# Patient Record
Sex: Male | Born: 1955 | Race: Black or African American | Hispanic: No | Marital: Single | Smoking: Current every day smoker
Health system: Southern US, Community
[De-identification: ages and names within clinical notes are randomized; demographics above are authoritative.]

---

## 2017-07-22 ENCOUNTER — Other Ambulatory Visit: Payer: Self-pay

## 2017-07-22 ENCOUNTER — Emergency Department (HOSPITAL_COMMUNITY): Payer: Medicare Other

## 2017-07-22 ENCOUNTER — Encounter (HOSPITAL_COMMUNITY): Payer: Self-pay

## 2017-07-22 ENCOUNTER — Emergency Department (HOSPITAL_COMMUNITY)
Admission: EM | Admit: 2017-07-22 | Discharge: 2017-07-22 | Disposition: A | Discharge status: Home / Self Care | Payer: Medicare Other | Attending: Emergency Medicine | Admitting: Emergency Medicine

## 2017-07-22 DIAGNOSIS — F17200 Nicotine dependence, unspecified, uncomplicated: Secondary | ICD-10-CM | POA: Insufficient documentation

## 2017-07-22 DIAGNOSIS — R55 Syncope and collapse: Secondary | ICD-10-CM | POA: Diagnosis not present

## 2017-07-22 DIAGNOSIS — R079 Chest pain, unspecified: Secondary | ICD-10-CM | POA: Diagnosis present

## 2017-07-22 DIAGNOSIS — Z79899 Other long term (current) drug therapy: Secondary | ICD-10-CM | POA: Insufficient documentation

## 2017-07-22 DIAGNOSIS — I10 Essential (primary) hypertension: Secondary | ICD-10-CM | POA: Insufficient documentation

## 2017-07-22 DIAGNOSIS — R10812 Left upper quadrant abdominal tenderness: Secondary | ICD-10-CM | POA: Diagnosis not present

## 2017-07-22 DIAGNOSIS — R10814 Left lower quadrant abdominal tenderness: Secondary | ICD-10-CM | POA: Diagnosis not present

## 2017-07-22 LAB — BASIC METABOLIC PANEL
ANION GAP: 9 (ref 5–15)
BUN: 11 mg/dL (ref 6–20)
CO2: 26 mmol/L (ref 22–32)
Calcium: 8.9 mg/dL (ref 8.9–10.3)
Chloride: 104 mmol/L (ref 101–111)
Creatinine, Ser: 0.94 mg/dL (ref 0.61–1.24)
GFR calc non Af Amer: >60 mL/min (ref >60)
Glucose, Bld: 138 mg/dL — ABNORMAL HIGH (ref 65–99)
POTASSIUM: 4.2 mmol/L (ref 3.5–5.1)
SODIUM: 139 mmol/L (ref 135–145)

## 2017-07-22 LAB — CBC
HEMATOCRIT: 43 % (ref 39.0–52.0)
HEMOGLOBIN: 13.7 g/dL (ref 13.0–17.0)
MCH: 30 pg (ref 26.0–34.0)
MCHC: 31.9 g/dL (ref 30.0–36.0)
MCV: 94.3 fL (ref 78.0–100.0)
PLATELETS: 244 10*3/uL (ref 150–400)
RBC: 4.56 MIL/uL (ref 4.22–5.81)
RDW: 14.9 % (ref 11.5–15.5)
WBC: 5.7 10*3/uL (ref 4.0–10.5)

## 2017-07-22 LAB — I-STAT TROPONIN, ED
TROPONIN I, POC: 0 ng/mL (ref 0.00–0.08)
Troponin i, poc: 0 ng/mL (ref 0.00–0.08)

## 2017-07-22 NOTE — ED Notes (Signed)
Pt states hx of cocaine use, last used 1 week ago. MD aware

## 2017-07-22 NOTE — Discharge Instructions (Signed)
Your testing today has not shown any specific abnormal findings.  You should continue to follow-up with your family doctor within 2 days if no improvement, return to the emergency department for worsening chest pain difficulty breathing fevers or vomiting.

## 2017-07-22 NOTE — ED Triage Notes (Signed)
[place] arrives [place] [facility] [place] after leaving court. As as soon leaving court patient began to have left sided chest pain with nausea. Pt was given 325 ASA and 1 sl nitro tablet. Pt has 18 G IV in A/c. Pt report family history of MI.

## 2017-07-22 NOTE — ED Notes (Signed)
Lab called stating BMP hemolyzed for second time; will straight stick for sample and resend

## 2017-07-22 NOTE — ED Notes (Signed)
Patient verbalizes understanding of discharge instructions. Opportunity for questioning and answers were provided. Armband removed by staff, pt discharged from ED ambulatory.   

## 2017-07-22 NOTE — ED Provider Notes (Signed)
[facility] EMERGENCY DEPARTMENT Provider Note   CSN: [id number] Arrival date & time: 07/22/17  1126     History   Chief Complaint Chief Complaint  Patient presents with  . Chest Pain    HPI Devontay Celaya is a 62 y.o. male.  HPI  The patient is a 62 year old male, there is no history of this patient in our system and he refuses to give me much in the way of information though he does tell me that he has been diagnosed with high blood pressure and diabetes in the past though he does not take any medications despite being told to take them.  He reports that he is a smoker approximately 1-1/2 packs/day.  He presents to the hospital today with a complaint of left-sided chest and abdominal pain as well as a passing out spell which occurred just prior to arrival when the patient was leaving the court house after being arranged on charges of driving without a license.  The patient states that he had no issues with court, he was not feeling stressful, he left the court house and remembers falling to the ground and landing on the ground, the next thing he remembers she was in the ambulance.  At this time the patient denies feeling short of breath but has some left-sided chest pain.  He reports that he has been evaluated at other hospitals in the records are at those hospitals but does not give me any information.  Level 5 caveat applies secondary to the patient's lack of cooperation.  That being said review of the medical history and records from [facility] show that the patient was recently admitted approximately 1 month ago when he had a respiratory illness and required intubation for what was thought to be a possibility of pneumonia versus crackle lung versus viral infection.  It was noted that he had hepatitis C, hypertension and ultimately was found to have parainfluenza 2 virus.  He was discharged on Avelox, finished his medications.  During that hospitalization he did  have an elevated troponin at 0.2, he had a echocardiogram showing a 50% ejection fraction, a negative CT scan of the head and was positive for cocaine.  He also had a negative DVT study on June 24, 2017.  Vision states to me that he has had several episodes of passing out over time, he states no one can ever find out why, he does not recall any prodromal symptoms today.  Specifically he denied any chest pain or palpitations prior to passing out.  History reviewed. No pertinent past medical history.  There are no active problems to display for this patient.   History reviewed. No pertinent surgical history.     Home Medications    Prior to Admission medications   Medication Sig Start Date End Date Taking? Authorizing Provider  ofloxacin (OCUFLOX) 0.3 % ophthalmic solution Place 1 drop into the right eye 2 (two) times daily. 06/26/17  Yes [provider]  QUEtiapine (SEROQUEL) 300 MG tablet Take 300 mg by mouth at bedtime.   Yes [provider]    Family History History reviewed. No pertinent family history.  Social History Social History   Tobacco Use  . Smoking status: Current Every Day Smoker    Packs/day: 1.00  . Smokeless tobacco: Never Used  Substance Use Topics  . Alcohol use: Not on file  . Drug use: Yes    Types: Cocaine     Allergies   Patient has no  known allergies.   Review of Systems Review of Systems  All other systems reviewed and are negative.    Physical Exam Updated Vital Signs BP (!) 135/93   Pulse (!) 57   Temp 98 F (36.7 C) (Oral)   Resp 14   Ht 5\' 11"  (1.803 m)   Wt 67.6 kg (149 lb)   SpO2 100%   BMI 20.78 kg/m   Physical Exam  Constitutional: He appears well-developed and well-nourished. No distress.  HENT:  Head: Normocephalic and atraumatic.  Mouth/Throat: Oropharynx is clear and moist. No oropharyngeal exudate.  Eyes: EOM are normal. Right eye exhibits no discharge. Left eye exhibits no discharge. No  scleral icterus.  Haziness of the left cornea, right eye is more clear but the patient states he is very little site in his eye.  That is his baseline according to the patient  Neck: Normal range of motion. Neck supple. No JVD present. No thyromegaly present.  Cardiovascular: Normal rate, regular rhythm, normal heart sounds and intact distal pulses. Exam reveals no gallop and no friction rub.  No murmur heard. Pulmonary/Chest: Effort normal and breath sounds normal. No respiratory distress. He has no wheezes. He has no rales. He exhibits tenderness ( There is tenderness over the left side of the chest without rashes deformity crepitance or subcutaneous emphysema).  Abdominal: Soft. Bowel sounds are normal. He exhibits no distension and no mass. There is tenderness.  Mild tenderness over the left upper and left lower abdomen, no rashes, no right-sided tenderness, no guarding  Musculoskeletal: Normal range of motion. He exhibits no edema or tenderness.  The patient has full range of motion of all 4 extremities, there is no edema, no tenderness over the compartments, very supple joints  Lymphadenopathy:    He has no cervical adenopathy.  Neurological: He is alert. Coordination normal.  The patient has clear speech and is able to move all 4 extremities without difficulty, he follows commands without difficulty, he is very resistant to give very much information on his history but is able to answer simple commands and questions  Skin: Skin is warm and dry. No rash noted. No erythema.  Psychiatric: He has a normal mood and affect. His behavior is normal.  Nursing note and vitals reviewed.    ED Treatments / Results  Labs (all labs ordered are listed, but only abnormal results are displayed) Labs Reviewed  BASIC METABOLIC PANEL - Abnormal; Notable for the following components:      Result Value   Glucose, Bld 138 (*)    All other components within normal limits  CBC  I-STAT TROPONIN, ED    I-STAT TROPONIN, ED    EKG  EKG Interpretation  Date/Time:  Tuesday July 22 2017 11:28:37 EST Ventricular Rate:  64 PR Interval:  130 QRS Duration: 76 QT Interval:  394 QTC Calculation: 406 R Axis:   90 Text Interpretation:  Normal sinus rhythm Rightward axis Borderline ECG No old tracing to compare Confirmed by Eber Hong ([id number]) on 07/22/2017 11:50:01 AM       Radiology Dg Chest 2 View  Result Date: 07/22/2017 CLINICAL DATA:  Syncope. EXAM: CHEST  2 VIEW COMPARISON:  No prior. FINDINGS: Mediastinum and hilar structures normal. Lungs are clear. Heart size normal. No pleural effusion or pneumothorax. No acute bony abnormality. IMPRESSION: No acute cardiopulmonary disease. Electronically Signed   By: Maisie Fus  Register   On: 07/22/2017 11:58    Procedures Procedures (including critical care time)  Medications Ordered in ED  Medications - No data to display   Initial Impression / Assessment and Plan / ED Course  I have reviewed the triage vital signs and the nursing notes.  Pertinent labs & imaging results that were available during my care of the patient were reviewed by me and considered in my medical decision making (see chart for details).     There is no acute findings on the patient's chest x-ray or his EKG, he has had labs which thus far are unremarkable including a negative troponin, negative blood counts.  At this time the patient likely has had a syncopal episode of the cause is unclear.  He will need orthostatic vital signs.  I suspect that there is some drug use related in someway however he has never had any provocative testing of his heart.  He will need at least a second troponin.  Labs, imaging and EKG reviewed, no acute findings, second troponin also negative, patient has been sleeping and eating without complaint since arrival.  Final Clinical Impressions(s) / ED Diagnoses   Final diagnoses:  Syncope, unspecified syncope type  Chest pain, unspecified  type      Eber HongMiller, Kathlee Barnhardt, MD 07/22/17 1612

## 2018-06-09 IMAGING — CR DG CHEST 2V
2 series · 2 of 2 positions shown · non-contrast
Comparison: No prior.

CLINICAL DATA: Syncope.

EXAM:
CHEST  2 VIEW

[chest pa]
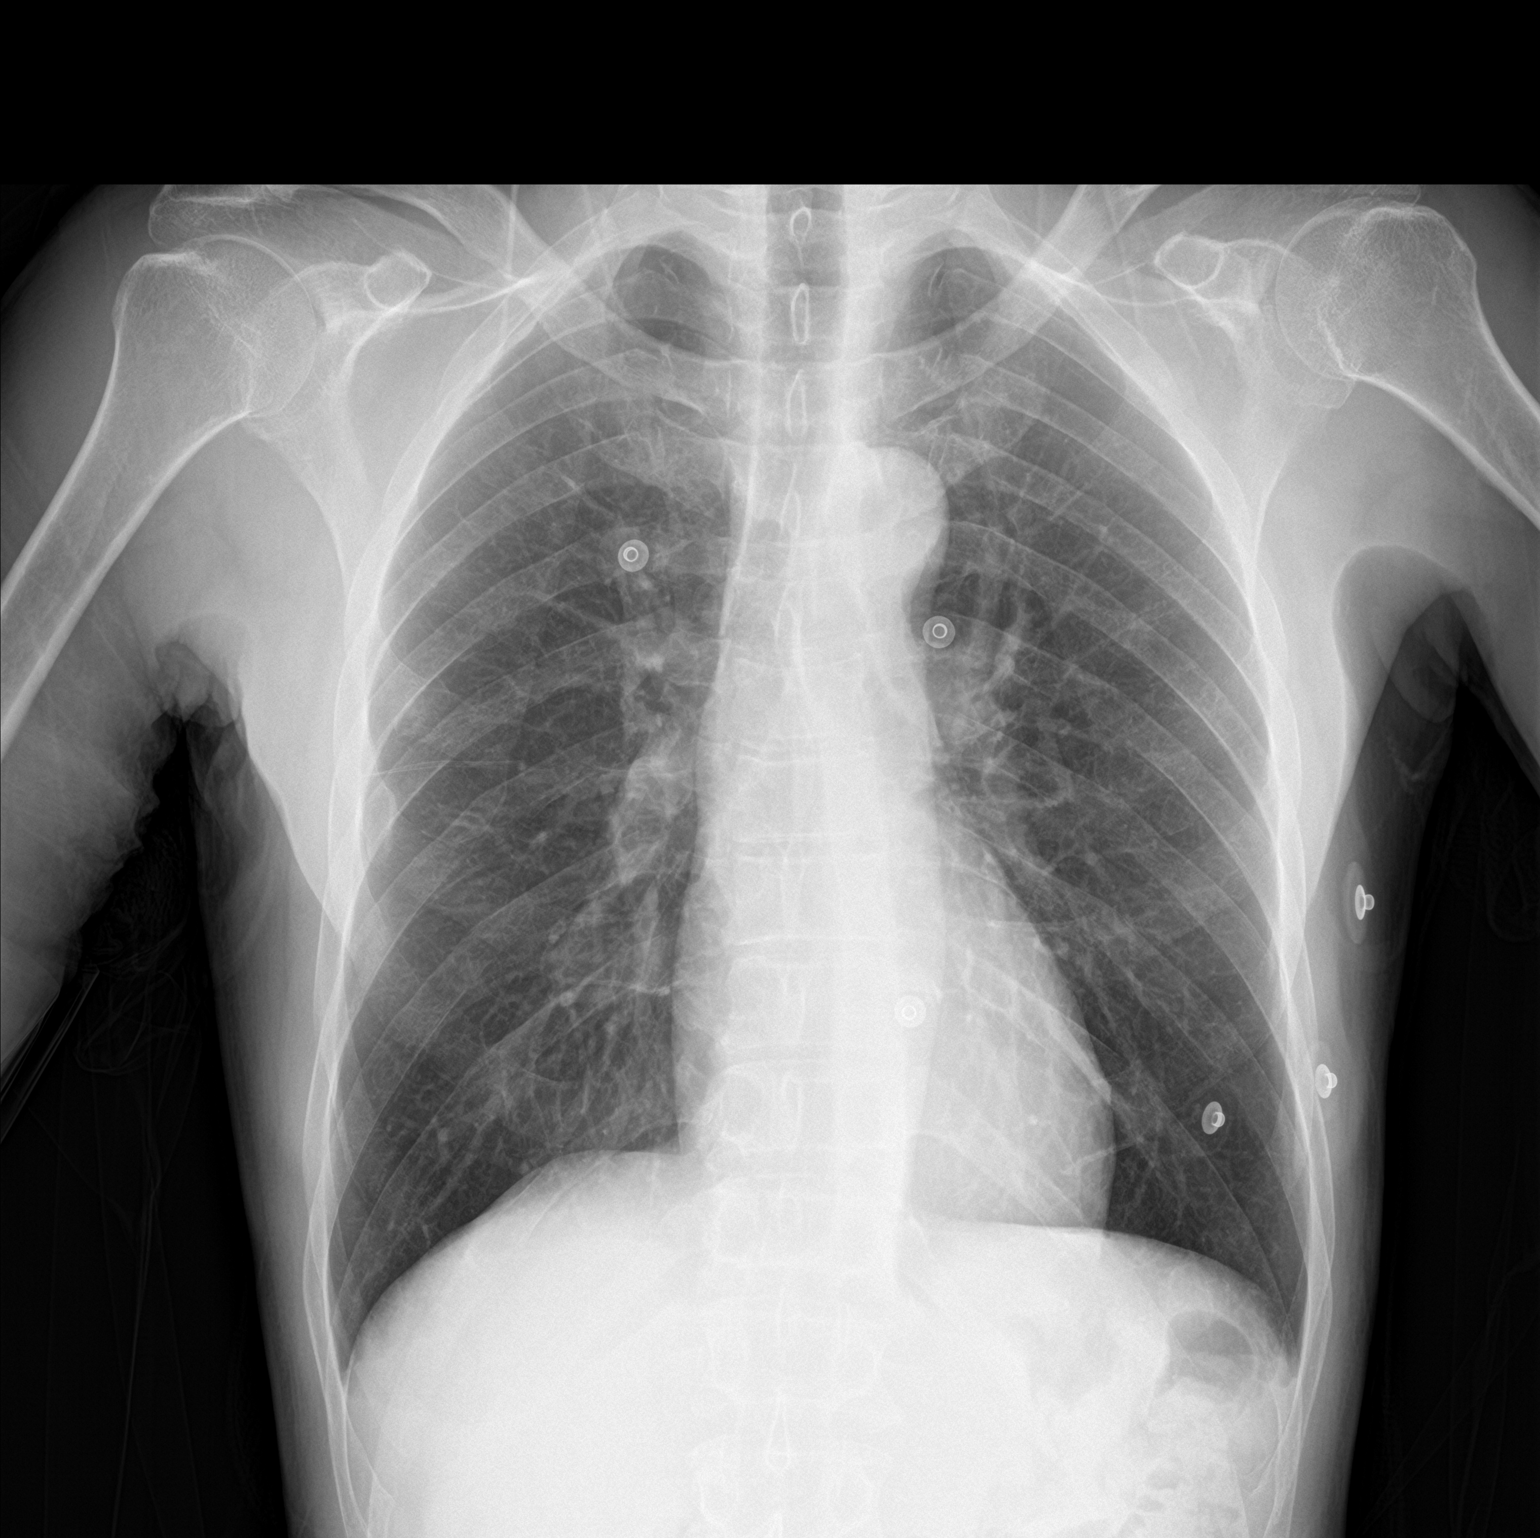

[chest lat]
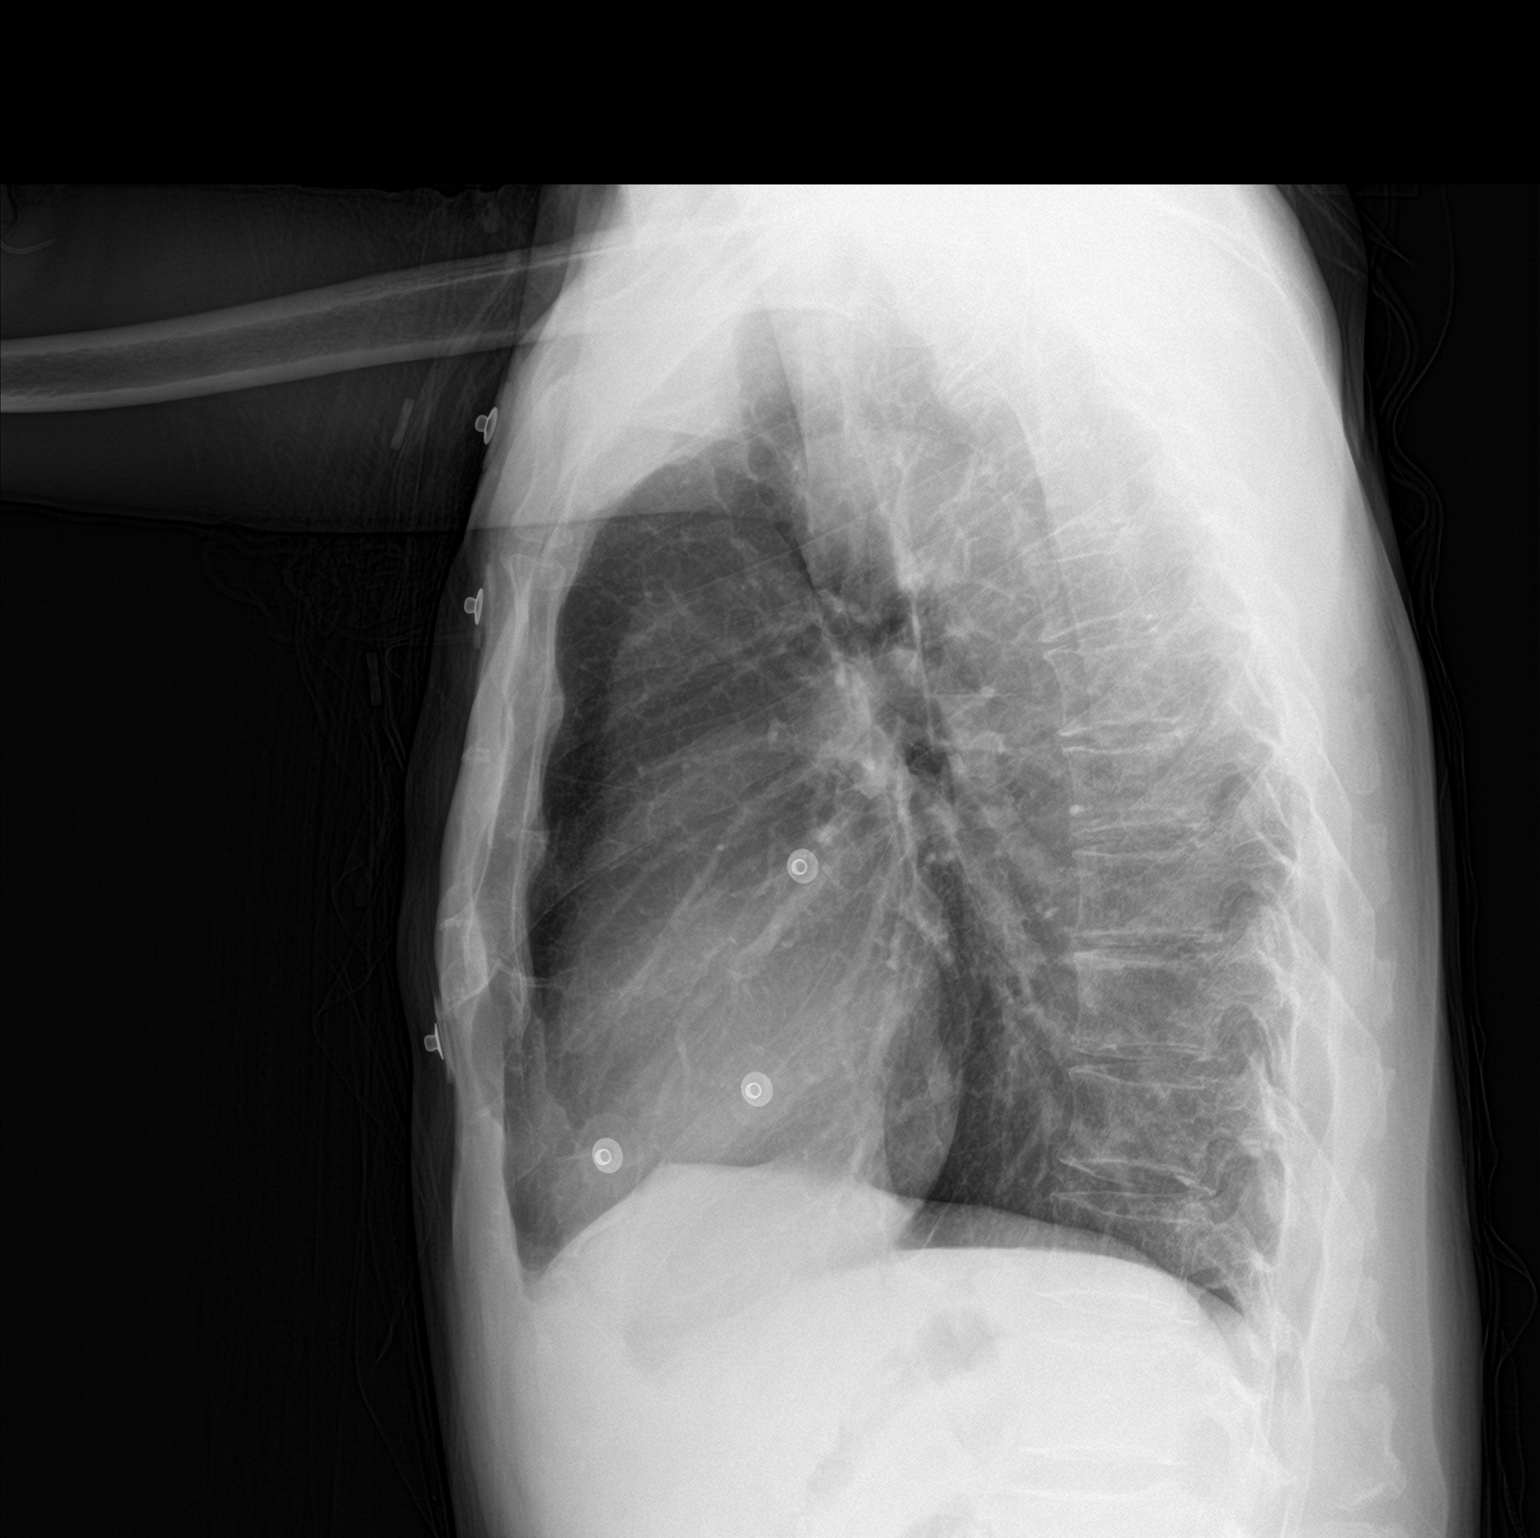

[2 of 2 positions shown; findings below may reference images not displayed]

FINDINGS: Mediastinum and hilar structures normal. Lungs are clear. Heart size
normal. No pleural effusion or pneumothorax. No acute bony
abnormality.
IMPRESSION: No acute cardiopulmonary disease.

## 2022-02-05 ENCOUNTER — Emergency Department (HOSPITAL_COMMUNITY): Payer: Medicare HMO

## 2022-02-05 ENCOUNTER — Observation Stay (HOSPITAL_COMMUNITY): Payer: Medicare HMO

## 2022-02-05 ENCOUNTER — Other Ambulatory Visit: Payer: Self-pay

## 2022-02-05 ENCOUNTER — Observation Stay (HOSPITAL_COMMUNITY)
Admission: EM | Admit: 2022-02-05 | Discharge: 2022-02-06 | Disposition: A | Discharge status: Home / Self Care | Payer: Medicare HMO | Attending: Internal Medicine | Admitting: Internal Medicine

## 2022-02-05 DIAGNOSIS — F17200 Nicotine dependence, unspecified, uncomplicated: Secondary | ICD-10-CM | POA: Diagnosis not present

## 2022-02-05 DIAGNOSIS — R55 Syncope and collapse: Principal | ICD-10-CM | POA: Diagnosis present

## 2022-02-05 DIAGNOSIS — Z79899 Other long term (current) drug therapy: Secondary | ICD-10-CM | POA: Insufficient documentation

## 2022-02-05 DIAGNOSIS — Z7982 Long term (current) use of aspirin: Secondary | ICD-10-CM | POA: Insufficient documentation

## 2022-02-05 DIAGNOSIS — S62001A Unspecified fracture of navicular [scaphoid] bone of right wrist, initial encounter for closed fracture: Secondary | ICD-10-CM

## 2022-02-05 DIAGNOSIS — M25541 Pain in joints of right hand: Secondary | ICD-10-CM | POA: Insufficient documentation

## 2022-02-05 DIAGNOSIS — F1090 Alcohol use, unspecified, uncomplicated: Secondary | ICD-10-CM

## 2022-02-05 DIAGNOSIS — Z789 Other specified health status: Secondary | ICD-10-CM

## 2022-02-05 DIAGNOSIS — M79601 Pain in right arm: Secondary | ICD-10-CM

## 2022-02-05 DIAGNOSIS — F1910 Other psychoactive substance abuse, uncomplicated: Secondary | ICD-10-CM

## 2022-02-05 LAB — CBC WITH DIFFERENTIAL/PLATELET
Abs Immature Granulocytes: 0.03 10*3/uL (ref 0.00–0.07)
Basophils Absolute: 0.1 10*3/uL (ref 0.0–0.1)
Basophils Relative: 1 %
Eosinophils Absolute: 0 10*3/uL (ref 0.0–0.5)
Eosinophils Relative: 1 %
HCT: 42.2 % (ref 39.0–52.0)
Hemoglobin: 13.7 g/dL (ref 13.0–17.0)
Immature Granulocytes: 1 %
Lymphocytes Relative: 41 %
Lymphs Abs: 2.5 10*3/uL (ref 0.7–4.0)
MCH: 30.6 pg (ref 26.0–34.0)
MCHC: 32.5 g/dL (ref 30.0–36.0)
MCV: 94.2 fL (ref 80.0–100.0)
Monocytes Absolute: 0.7 10*3/uL (ref 0.1–1.0)
Monocytes Relative: 12 %
Neutro Abs: 2.8 10*3/uL (ref 1.7–7.7)
Neutrophils Relative %: 44 %
Platelets: 211 10*3/uL (ref 150–400)
RBC: 4.48 MIL/uL (ref 4.22–5.81)
RDW: 14.1 % (ref 11.5–15.5)
WBC: 6.1 10*3/uL (ref 4.0–10.5)
nRBC: 0 % (ref 0.0–0.2)

## 2022-02-05 LAB — BASIC METABOLIC PANEL
Anion gap: 8 (ref 5–15)
BUN: 9 mg/dL (ref 8–23)
CO2: 25 mmol/L (ref 22–32)
Calcium: 9 mg/dL (ref 8.9–10.3)
Chloride: 103 mmol/L (ref 98–111)
Creatinine, Ser: 0.91 mg/dL (ref 0.61–1.24)
GFR, Estimated: >60 mL/min (ref >60)
Glucose, Bld: 98 mg/dL (ref 70–99)
Potassium: 4.3 mmol/L (ref 3.5–5.1)
Sodium: 136 mmol/L (ref 135–145)

## 2022-02-05 LAB — ETHANOL: Alcohol, Ethyl (B): <10 mg/dL (ref <10)

## 2022-02-05 LAB — HEPATIC FUNCTION PANEL
ALT: 44 U/L (ref 0–44)
AST: 50 U/L — ABNORMAL HIGH (ref 15–41)
Albumin: 3.1 g/dL — ABNORMAL LOW (ref 3.5–5.0)
Alkaline Phosphatase: 44 U/L (ref 38–126)
Bilirubin, Direct: 0.2 mg/dL (ref 0.0–0.2)
Indirect Bilirubin: 1 mg/dL — ABNORMAL HIGH (ref 0.3–0.9)
Total Bilirubin: 1.2 mg/dL (ref 0.3–1.2)
Total Protein: 7.2 g/dL (ref 6.5–8.1)

## 2022-02-05 LAB — TROPONIN I (HIGH SENSITIVITY)
Troponin I (High Sensitivity): 4 ng/L (ref <18)
Troponin I (High Sensitivity): 2 ng/L (ref <18)

## 2022-02-05 LAB — MAGNESIUM: Magnesium: 2.1 mg/dL (ref 1.7–2.4)

## 2022-02-05 LAB — TSH: TSH: 0.667 u[IU]/mL (ref 0.350–4.500)

## 2022-02-05 LAB — CK: Total CK: 83 U/L (ref 49–397)

## 2022-02-05 LAB — HIV ANTIBODY (ROUTINE TESTING W REFLEX): HIV Screen 4th Generation wRfx: NONREACTIVE

## 2022-02-05 MED ORDER — ACETAMINOPHEN 650 MG RE SUPP: 650.0000 mg | Freq: Four times a day (QID) | RECTAL | Status: DC | PRN

## 2022-02-05 MED ORDER — THIAMINE HCL 100 MG PO TABS
100.0000 mg | ORAL_TABLET | Freq: Every day | ORAL | Status: DC
  Administered 2022-02-06: 100 mg via ORAL
  Filled 2022-02-05 (×2): qty 1

## 2022-02-05 MED ORDER — ACETAMINOPHEN 325 MG PO TABS: 650.0000 mg | ORAL_TABLET | Freq: Four times a day (QID) | ORAL | Status: DC | PRN

## 2022-02-05 MED ORDER — OXYCODONE HCL 5 MG PO TABS
5.0000 mg | ORAL_TABLET | ORAL | Status: DC | PRN
  Administered 2022-02-06: 5 mg via ORAL
  Filled 2022-02-05: qty 1

## 2022-02-05 MED ORDER — ADULT MULTIVITAMIN W/MINERALS CH
1.0000 | ORAL_TABLET | Freq: Every day | ORAL | Status: DC
Start: 2022-02-05 — End: 2022-02-06
  Administered 2022-02-05 – 2022-02-06 (×2): 1 via ORAL
  Filled 2022-02-05 (×2): qty 1

## 2022-02-05 MED ORDER — SODIUM CHLORIDE 0.9 % IV BOLUS
1000.0000 mL | Freq: Once | INTRAVENOUS | Status: AC
  Administered 2022-02-05: 1000 mL via INTRAVENOUS

## 2022-02-05 MED ORDER — LORAZEPAM 1 MG PO TABS: 1.0000 mg | ORAL_TABLET | ORAL | Status: DC | PRN

## 2022-02-05 MED ORDER — FENTANYL CITRATE PF 50 MCG/ML IJ SOSY
100.0000 ug | PREFILLED_SYRINGE | Freq: Once | INTRAMUSCULAR | Status: AC
  Administered 2022-02-05: 100 ug via INTRAVENOUS
  Filled 2022-02-05: qty 2

## 2022-02-05 MED ORDER — ENOXAPARIN SODIUM 40 MG/0.4ML IJ SOSY
40.0000 mg | PREFILLED_SYRINGE | INTRAMUSCULAR | Status: DC
  Administered 2022-02-05: 40 mg via SUBCUTANEOUS
  Filled 2022-02-05: qty 0.4

## 2022-02-05 MED ORDER — LORAZEPAM 2 MG/ML IJ SOLN: 1.0000 mg | INTRAMUSCULAR | Status: DC | PRN

## 2022-02-05 MED ORDER — FOLIC ACID 1 MG PO TABS
1.0000 mg | ORAL_TABLET | Freq: Every day | ORAL | Status: DC
  Administered 2022-02-05 – 2022-02-06 (×2): 1 mg via ORAL
  Filled 2022-02-05 (×2): qty 1

## 2022-02-05 MED ORDER — SODIUM CHLORIDE 0.9% FLUSH
3.0000 mL | Freq: Two times a day (BID) | INTRAVENOUS | Status: DC
  Administered 2022-02-06: 3 mL via INTRAVENOUS

## 2022-02-05 MED ORDER — SODIUM CHLORIDE 0.9 % IV SOLN: INTRAVENOUS | Status: AC

## 2022-02-05 MED ORDER — THIAMINE HCL 100 MG/ML IJ SOLN
100.0000 mg | Freq: Every day | INTRAMUSCULAR | Status: DC
  Administered 2022-02-05: 100 mg via INTRAVENOUS
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
## 2022-02-05 NOTE — ED Provider Notes (Signed)
[facility] EMERGENCY DEPARTMENT Provider Note   CSN: [id number] Arrival date & time: 02/05/22  1047     History Chief Complaint  Patient presents with   Chest Pain    Bryan Compton is a 66 y.o. male reportedly otherwise healthy presents the emergency department for evaluation of syncopal episode today.  Patient reports that his car broke down and [place] the [place] standing and Reports that he had a syncopal episode.  He reports that he felt a little lightheaded before, but passed out.  He reports that he rumors waking up with EMS. He does report that he has some left-sided rib pain but denies any shortness of breath or chest pain.  He does report some right thumb and wrist pain.  Denies any abdominal pain, nausea, vomiting, fevers, headache.  Denies any blood thinner use.   Chest Pain Associated symptoms: no abdominal pain, no back pain, no cough, no fever, no headache, no nausea, no palpitations, no shortness of breath, no vomiting and no weakness        Home Medications Prior to Admission medications   Medication Sig Start Date End Date Taking? Authorizing Provider  ofloxacin (OCUFLOX) 0.3 % ophthalmic solution Place 1 drop into the right eye 2 (two) times daily. 06/26/17   [provider]  QUEtiapine (SEROQUEL) 300 MG tablet Take 300 mg by mouth at bedtime.    [provider]      Allergies    Patient has no known allergies.    Review of Systems   Review of Systems  Constitutional:  Negative for chills and fever.  Respiratory:  Negative for cough and shortness of breath.   Cardiovascular:  Positive for chest pain. Negative for palpitations.  Gastrointestinal:  Negative for abdominal pain, nausea and vomiting.  Musculoskeletal:  Positive for arthralgias. Negative for back pain and neck pain.  Neurological:  Positive for light-headedness. Negative for weakness and headaches.    Physical Exam Updated Vital Signs BP 135/87  (BP Location: Left Arm)   Pulse 63   Temp 97.9 F (36.6 C) (Oral)   Resp 16   SpO2 97%  Physical Exam Vitals and nursing note reviewed.  Constitutional:      General: He is not in acute distress.    Appearance: He is not toxic-appearing.  HENT:     Head: Normocephalic and atraumatic.  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  Cardiovascular:     Rate and Rhythm: Normal rate.  Pulmonary:     Breath sounds: Normal breath sounds. No decreased breath sounds.  Chest:     Chest wall: Tenderness present.     Comments: Tenderness to the left lower chest.  No signs of trauma.  No step-offs or deformities noted. Abdominal:     General: Bowel sounds are normal.     Palpations: Abdomen is soft.     Tenderness: There is no abdominal tenderness.  Musculoskeletal:     Comments: He is some tenderness to the right snuffbox.  Cap refill intact.  Pulses are present.  Compartments are soft.  He has no midline cervical, thoracic, or lumbar tenderness palpation.  He does have some lower lumbar paraspinal tenderness to palpation.  No step-offs or deformities noted.  Patient can move all 4 extremities.  He does have some tenderness overlying the right hip.  Neurological:     General: No focal deficit present.  Mental Status: He is alert.     Comments: Cranial nerves II through XII intact.  Patient moving all extremities with ease.  Sensation intact throughout.     ED Results / Procedures / Treatments   Labs (all labs ordered are listed, but only abnormal results are displayed) Labs Reviewed  BASIC METABOLIC PANEL  CBC WITH DIFFERENTIAL/PLATELET  ETHANOL  HEPATIC FUNCTION PANEL  TROPONIN I (HIGH SENSITIVITY)  TROPONIN I (HIGH SENSITIVITY)    EKG None  Radiology CT Head Wo Contrast  Result Date: 02/05/2022 CLINICAL DATA:  Neck trauma EXAM: CT HEAD WITHOUT CONTRAST CT CERVICAL SPINE WITHOUT CONTRAST TECHNIQUE: Multidetector CT imaging of the head  and cervical spine was performed following the standard protocol without intravenous contrast. Multiplanar CT image reconstructions of the cervical spine were also generated. RADIATION DOSE REDUCTION: This exam was performed according to the departmental dose-optimization program which includes automated exposure control, adjustment of the mA and/or kV according to patient size and/or use of iterative reconstruction technique. COMPARISON:  None Available. FINDINGS: CT HEAD FINDINGS Brain: Chronic white matter ischemic change. 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 No evidence of acute infarction, hemorrhage, hydrocephalus, extra-axial collection or mass lesion/mass effect. Vascular: No hyperdense vessel or unexpected calcification. Skull: Normal. Negative for fracture or focal lesion. Sinuses/Orbits: No acute finding. Other: None. CT CERVICAL SPINE FINDINGS Alignment: Normal. Skull base and vertebrae: No acute fracture. No primary bone lesion or focal pathologic process. Soft tissues and spinal canal: No prevertebral fluid or swelling. No visible canal hematoma. Disc levels: Mild degenerative disc disease, most pronounced at C4-C5 and C5-C6. Mild facet arthropathy, most pronounced in the upper cervical spine. Upper chest: Negative. Other: None. IMPRESSION: 1. No acute intracranial abnormality. 2. No evidence of acute cervical spine fracture or traumatic malalignment. Electronically Signed   By: Allegra Lai M.D.   On: 02/05/2022 15:29   CT Cervical Spine Wo Contrast  Result Date: 02/05/2022 CLINICAL DATA:  Neck trauma EXAM: CT HEAD WITHOUT CONTRAST CT CERVICAL SPINE WITHOUT CONTRAST TECHNIQUE: Multidetector CT imaging of the head and cervical spine was performed following the standard protocol without intravenous contrast. Multiplanar CT image reconstructions of the cervical spine were also generated. RADIATION DOSE REDUCTION: This exam was performed according to the departmental  dose-optimization program which includes automated exposure control, adjustment of the mA and/or kV according to patient size and/or use of iterative reconstruction technique. COMPARISON:  None Available. FINDINGS: CT HEAD FINDINGS Brain: Chronic white matter ischemic change. 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 No evidence of acute infarction, hemorrhage, hydrocephalus, extra-axial collection or mass lesion/mass effect. Vascular: No hyperdense vessel or unexpected calcification. Skull: Normal. Negative for fracture or focal lesion. Sinuses/Orbits: No acute finding. Other: None. CT CERVICAL SPINE FINDINGS Alignment: Normal. Skull base and vertebrae: No acute fracture. No primary bone lesion or focal pathologic process. Soft tissues and spinal canal: No prevertebral fluid or swelling. No visible canal hematoma. Disc levels: Mild degenerative disc disease, most pronounced at C4-C5 and C5-C6. Mild facet arthropathy, most pronounced in the upper cervical spine. Upper chest: Negative. Other: None. IMPRESSION: 1. No acute intracranial abnormality. 2. No evidence of acute cervical spine fracture or traumatic malalignment. Electronically Signed   By: Allegra Lai M.D.   On: 02/05/2022 15:29   DG Ribs Unilateral W/Chest Left  Result Date: 02/05/2022 CLINICAL DATA:  Larey Seat down as was exiting car. Anterior left lower rib pain. EXAM: LEFT RIBS AND CHEST - 3+ VIEW COMPARISON:  Chest two  views 07/22/2017 FINDINGS: Cardiac silhouette and mediastinal contours are within normal limits. There is again mild flattening of the diaphragms and hyperinflation. The lungs are clear. No pleural effusion or pneumothorax. There are 12 rib-bearing thoracic type vertebral bodies. No acute displaced rib fracture is seen, with attention to the anterior inferior rib area marked with a BB marker. Minimal calcification superior and lateral to the right humeral head, possibly chronic calcific tendinosis of the  superior rotator cuff. IMPRESSION: 1. No acute displaced rib fracture is seen. 2. No pneumothorax. Electronically Signed   By: Yvonne Kendall M.D.   On: 02/05/2022 11:32   DG Hand Complete Right  Result Date: 02/05/2022 CLINICAL DATA:  Fall.  Right hand proximal first digit injury. EXAM: RIGHT HAND - COMPLETE 3+ VIEW COMPARISON:  None Available. FINDINGS: Normal bone mineralization. There is a 3 mm ossicle at the distal lateral aspect of the scaphoid that appears to be well corticated and chronic. However, there is mild curvilinear lucency within the distal lateral aspect of the scaphoid that appears to be chronic and represent a bone nutrient foramen. Tiny 3 mm well corticated likely chronic ossicle just dorsal to the lunate on lateral view. Curvilinear 3 x 1 mm bone density just volar to the distal scaphoid on lateral view. Mild thumb interphalangeal joint space narrowing and dorsal osteophytosis with mild dorsal degenerative ossicle. Mild dorsal and medial osteophytosis at the base of the distal phalanx of fourth finger. IMPRESSION: 1. There is an ossicle that appears to be chronic, bordering the distal lateral aspect of the scaphoid on oblique view. There is an additional tiny ossicle seen just volar to the distal aspect of the scaphoid on lateral view (that could represent the same ossicle) that is age indeterminate. A curvilinear lucency within the distal lateral aspect of the scaphoid is favored to be chronic it is difficult to exclude an acute scaphoid fracture. Recommend clinical correlation for snuffbox tenderness. If there is clinical concern for a scaphoid fracture, consider splinting and follow-up radiographs in 10-14 days versus further acute evaluation with a CT of the right wrist. 2. 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 Electronically Signed   By: Yvonne Kendall M.D.   On: 02/05/2022 11:29    Procedures Procedures   Medications Ordered in ED Medications   sodium chloride 0.9 % bolus 1,000 mL (1,000 mLs Intravenous New Bag/Given 02/05/22 1539)  fentaNYL (SUBLIMAZE) injection 100 mcg (100 mcg Intravenous Given 02/05/22 1541)    ED Course/ Medical Decision Making/ A&P Clinical Course as of 02/05/22 1739  Tue Feb 05, 2022  1702 I spoke with Dr. Rogers Blocker who will see the patient for admission. [PB]    Clinical Course User Index [PB] Loni Beckwith, PA-C                           Medical Decision Making Amount and/or Complexity of Data Reviewed Labs: ordered. Radiology: ordered.  Risk Prescription drug management.   66 year old male presents the emergency department for evaluation of syncopal episode today.  Differential diagnosis includes is not opted to cardiogenic syncope, vasovagal syncope dehydration, electrolyte abnormality, rhabdomyolysis, substance intoxication, alcohol intoxication.  Vital signs are unremarkable.  Patient afebrile, normal pulse rate, normotensive, satting well on room air with any increased work of breathing.  Physical exam as seen above.  CT imaging ordered.  Labs ordered. EKG ordered.   On prior chart review, patient has presented with similar symptoms in the  past on 07-22-2017, he was seen at [facility] emergency department.  From this note, they mention the patient has a medical history of hypertension and diabetes which the patient denies.  He was ultimately discharged home from the emergency department for a negative work-up.  He was admitted on 12-15-2020 at [facility] for multiple issues including cocaine intoxication, toxic encephalopathy, pneumonia.  The patient came in with a GCS of 3 and had to be emergently intubated.  There is aspirate noted on his chest x-ray which they assumed was from drug overdose aspiration. EF 35-40% noted at that time.   I independently reviewed and interpreted the patient's labs.  BMP shows no electrolyte abnormality.  CBC shows no leukocytosis or anemia.   Initial troponin at 2, delta pending.  Ethanol pending, hepatic function panel pending, UDS pending, urinalysis pending, CK pending.  CT imaging shows CT imaging shows of the head and cervical spine 1. No acute intracranial abnormality. 2. No evidence of acute cervical spine fracture or traumatic malalignment.  Chest x-ray of the unilateral ribs shows no acute displaced rib fracture seen or no apparent pneumothorax.  X-ray of the right hand shows 1. There is an ossicle that appears to be chronic, bordering the distal lateral aspect of the scaphoid on oblique view. There is an additional tiny ossicle seen just volar to the distal aspect of the scaphoid on lateral view (that could represent the same ossicle) that is age indeterminate. A curvilinear lucency within the distal lateral aspect of the scaphoid is favored to be chronic it is difficult to exclude an acute scaphoid fracture. Recommend clinical correlation for snuffbox tenderness. If there is clinical concern for a scaphoid fracture, consider splinting and follow-up radiographs in 10-14 days versus further acute evaluation with a CT of the right wrist. 2. 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    X-ray of the lumbar pending, x-ray of hips and pelvis pending.  Will need orthopedic consult.  At this time, will handoff to oncoming shift.  4:23 PM Care of Arlo Hoar transferred to [facility] at the end of my shift as the patient will require reassessment once labs/imaging have resulted. Patient presentation, ED course, and plan of care discussed with review of all pertinent labs and imaging. Please see his/her note for further details regarding further ED course and disposition. Plan at time of handoff is follow up on labs, imaging, consult to orthopedics, thumb spica. This may be altered or completely changed at the discretion of the oncoming team pending results of further workup.  Final Clinical  Impression(s) / ED Diagnoses Final diagnoses:  None    Rx / DC Orders ED Discharge Orders     None         Sherrell Puller, PA-C 02/05/22 1745    Tegeler, Gwenyth Allegra, MD 02/06/22 [phone]

## 2022-02-05 NOTE — ED Notes (Signed)
Patient transported to CT 

## 2022-02-05 NOTE — ED Provider Triage Note (Signed)
Emergency Medicine Provider Triage Evaluation Note  Bryan Compton , a 66 y.o. male  was evaluated in triage.  Pt complains of chest pain after fall.  Arrives via EMS.  Reportedly his car broke down and he was standing out in the heat waiting for someone to pick him up.  When the friend arrived he started to pass out and fell against the car.  Reports since then he has had pain over the left side of his chest and also reports some right hand and thumb pain.  Did not fall to the ground or hit his head.  Chest pain worse with moving or laughter  Review of Systems  Positive: Chest pain, syncope, fall, hand pain Negative: Shortness of breath, numbness, weakness  Physical Exam  BP (!) 146/96 (BP Location: Right Arm)   Pulse 67   Temp 97.9 F (36.6 C) (Oral)   Resp 20   SpO2 100%  Gen:   Awake, no distress   Resp:  Normal effort, tenderness over the left ribs, breath sounds present and equal bilaterally MSK:   Moves extremities without difficulty, tenderness over right thumb Other:    Medical Decision Making  Medically screening exam initiated at 10:56 AM.  Appropriate orders placed.  Bryan Compton was informed that the remainder of the evaluation will be completed by another provider, this initial triage assessment does not replace that evaluation, and the importance of remaining in the ED until their evaluation is complete.  Labs and imaging ordered, suspect CP more so due to fall rather than ACS   Dartha Lodge, [place] 02/05/22 1101

## 2022-02-05 NOTE — Assessment & Plan Note (Signed)
States he drinks 3-4 beers/day  ciwa protocol Mv/thiamine and folic acid

## 2022-02-05 NOTE — ED Notes (Signed)
Patient transported to X-ray 

## 2022-02-05 NOTE — Progress Notes (Signed)
Orthopedic Tech Progress Note Patient Details:  Bryan Compton September 29, 1955 [id number]  Ortho Devices Type of Ortho Device: Thumb velcro splint Ortho Device/Splint Location: rue Ortho Device/Splint Interventions: Ordered  Pt was in x-ray when I came down.    Al Decant 02/05/2022, 7:41 PM

## 2022-02-05 NOTE — Assessment & Plan Note (Signed)
Check UDS, history of cocaine use with hospitalization and intubation in 2022  Declines nicotine patch

## 2022-02-05 NOTE — ED Triage Notes (Signed)
[place] [facility] [place] rib pain. Patient received 324mg  ASA from EMS PTA. Patient was waiting outside when he states he got hot and fell into a car. 20g saline lock in left AC. VSS

## 2022-02-05 NOTE — Assessment & Plan Note (Addendum)
S/p fall. Right hand/wrist CT with no fracture in scaphoid Ortho consulted Thumb spica ordered, f/u outpatient with ortho  Check xray right shoulder and forearm as has pain on entire arm  Prn oxycodone for severe pain

## 2022-02-06 ENCOUNTER — Observation Stay (HOSPITAL_BASED_OUTPATIENT_CLINIC_OR_DEPARTMENT_OTHER): Payer: Medicare HMO

## 2022-02-06 DIAGNOSIS — R55 Syncope and collapse: Secondary | ICD-10-CM | POA: Diagnosis not present

## 2022-02-06 LAB — CBC
HCT: 40.1 % (ref 39.0–52.0)
Hemoglobin: 12.6 g/dL — ABNORMAL LOW (ref 13.0–17.0)
MCH: 29.8 pg (ref 26.0–34.0)
MCHC: 31.4 g/dL (ref 30.0–36.0)
MCV: 94.8 fL (ref 80.0–100.0)
Platelets: 184 10*3/uL (ref 150–400)
RBC: 4.23 MIL/uL (ref 4.22–5.81)
RDW: 14.2 % (ref 11.5–15.5)
WBC: 5.6 10*3/uL (ref 4.0–10.5)
nRBC: 0 % (ref 0.0–0.2)

## 2022-02-06 LAB — URINALYSIS, ROUTINE W REFLEX MICROSCOPIC
Bilirubin Urine: NEGATIVE
Glucose, UA: NEGATIVE mg/dL
Hgb urine dipstick: NEGATIVE
Ketones, ur: NEGATIVE mg/dL
Leukocytes,Ua: NEGATIVE
Nitrite: NEGATIVE
Protein, ur: NEGATIVE mg/dL
Specific Gravity, Urine: 1.015 (ref 1.005–1.030)
pH: 5 (ref 5.0–8.0)

## 2022-02-06 LAB — RAPID URINE DRUG SCREEN, HOSP PERFORMED
Amphetamines: NOT DETECTED
Barbiturates: NOT DETECTED
Benzodiazepines: NOT DETECTED
Cocaine: POSITIVE — AB
Opiates: NOT DETECTED
Tetrahydrocannabinol: NOT DETECTED

## 2022-02-06 LAB — ECHOCARDIOGRAM COMPLETE
Area-P 1/2: 2.3 cm2
S' Lateral: 3 cm

## 2022-02-06 MED ORDER — ACETAMINOPHEN 325 MG PO TABS: 650.0000 mg | ORAL_TABLET | Freq: Four times a day (QID) | ORAL | Status: AC | PRN

## 2022-02-06 MED ORDER — ADULT MULTIVITAMIN W/MINERALS CH: 1.0000 | ORAL_TABLET | Freq: Every day | ORAL | Status: AC

## 2022-02-06 MED ORDER — FOLIC ACID 1 MG PO TABS: 1.0000 mg | ORAL_TABLET | Freq: Every day | ORAL | Status: AC

## 2022-02-06 MED ORDER — THIAMINE HCL 100 MG PO TABS: 100.0000 mg | ORAL_TABLET | Freq: Every day | ORAL | Status: AC

## 2022-02-06 NOTE — ED Notes (Signed)
Pt being transported to [facility]

## 2022-02-06 NOTE — Progress Notes (Signed)
Explained discharge instructions and reviewed next medication administration times. Patient verbalized having an understanding. His IV was removed prior to explaining the discharge instructions by staff RN. All belongings are in the patient's possession. He was transported down to the discharge lounge to await his ride.

## 2022-02-06 NOTE — Progress Notes (Signed)
Echocardiogram 2D Echocardiogram has been performed.  Warren Lacy Filiberto Wamble RDCS 02/06/2022, 11:07 AM

## 2022-02-06 NOTE — Evaluation (Addendum)
Physical Therapy Evaluation Patient Details Name: Bryan Compton MRN: [id number] DOB: 24-May-1956 Today's Date: 02/06/2022  History of Present Illness  66 y.o. male presents to [facility] hospital on 02/05/2022 after syncopal episode and fall. PMH includes cocaine abuse.  Clinical Impression  Pt presents to PT with mild instability initially, with balance and gait quality improving with further ambulation distances. Pt reports one episode of dizziness during ambulation, with symptoms improving with seated rest break. Pt's BP stable during session. Pt will benefit from continued aggressive mobilization and PT services in an effort to restore independence. PT anticipates a quick progression of balance with increased mobility, no PT recommended post-discharge.     Recommendations for follow up therapy are one component of a multi-disciplinary discharge planning process, led by the attending physician.  Recommendations may be updated based on patient status, additional functional criteria and insurance authorization.  Follow Up Recommendations No PT follow up      Assistance Recommended at Discharge PRN  Patient can return home with the following  A little help with bathing/dressing/bathroom;Assistance with cooking/housework;Assist for transportation;Help with stairs or ramp for entrance    Equipment Recommendations None recommended by PT (pt owns a cane)  Recommendations for Other Services       Functional Status Assessment Patient has had a recent decline in their functional status and demonstrates the ability to make significant improvements in function in a reasonable and predictable amount of time.     Precautions / Restrictions Precautions Precautions: Fall Precaution Comments: monitor orthostatic symptoms Restrictions Weight Bearing Restrictions: No      Mobility  Bed Mobility Overal bed mobility: Modified Independent                  Transfers Overall transfer level: Needs  assistance Equipment used: None Transfers: Sit to/from Stand Sit to Stand: Supervision           General transfer comment: posterior loss of balance after initial sit to stand from bed, improved from commode    Ambulation/Gait Ambulation/Gait assistance: Supervision Gait Distance (Feet): 300 Feet Assistive device: None Gait Pattern/deviations: Step-through pattern, Drifts right/left Gait velocity: functional Gait velocity interpretation: 1.31 - 2.62 ft/sec, indicative of limited community ambulator   General Gait Details: pt with increased lateral and anterior-posterior sway initially, improving with further ambulation distances. One standing break due to reports of dizziness, which subsides with seated break. BP 124/95  Stairs            Wheelchair Mobility    Modified Rankin (Stroke Patients Only)       Balance Overall balance assessment: Needs assistance Sitting-balance support: No upper extremity supported, Feet supported Sitting balance-Leahy Scale: Good     Standing balance support: No upper extremity supported, During functional activity Standing balance-Leahy Scale: Good                               Pertinent Vitals/Pain Pain Assessment Pain Assessment: Faces Faces Pain Scale: Hurts even more Pain Location: R flank Pain Descriptors / Indicators: Grimacing Pain Intervention(s): Monitored during session    Home Living Family/patient expects to be discharged to:: Private residence Living Arrangements: Other relatives Available Help at Discharge: Family;Available PRN/intermittently Type of Home: House Home Access: Stairs to enter Entrance Stairs-Rails: None Entrance Stairs-Number of Steps: 1   Home Layout: One level Home Equipment: Cane - single point      Prior Function Prior Level of Function : Independent/Modified Independent;Driving  Mobility Comments: retired from work as a Restaurant manager, fast food Extremity Assessment Upper Extremity Assessment: Overall WFL for tasks assessed    Lower Extremity Assessment Lower Extremity Assessment: Overall WFL for tasks assessed    Cervical / Trunk Assessment Cervical / Trunk Assessment: Normal  Communication   Communication: No difficulties  Cognition Arousal/Alertness: Awake/alert Behavior During Therapy: WFL for tasks assessed/performed Overall Cognitive Status: Within Functional Limits for tasks assessed                                          General Comments General comments (skin integrity, edema, etc.): VSS on RA, pt reports brief period of dizziness near completion of ambulation, this resolves quickly once seated. BP 124/95.    Exercises     Assessment/Plan    PT Assessment Patient needs continued PT services  PT Problem List Decreased balance;Decreased activity tolerance       PT Treatment Interventions DME instruction;Gait training;Balance training;Neuromuscular re-education;Patient/family education    PT Goals (Current goals can be found in the Care Plan section)  Acute Rehab PT Goals Patient Stated Goal: to go home PT Goal Formulation: With patient Time For Goal Achievement: 02/20/22 Potential to Achieve Goals: Good Additional Goals Additional Goal #1: Pt will score >19/24 on the DGI to indicate a reduced risk for falls    Frequency Min 3X/week     Co-evaluation               AM-PAC PT "6 Clicks" Mobility  Outcome Measure Help needed turning from your back to your side while in a flat bed without using bedrails?: None Help needed moving from lying on your back to sitting on the side of a flat bed without using bedrails?: None Help needed moving to and from a bed to a chair (including a wheelchair)?: A Little Help needed standing up from a chair using your arms (e.g., wheelchair or bedside chair)?: A Little Help needed to walk in hospital  room?: A Little Help needed climbing 3-5 steps with a railing? : A Little 6 Click Score: 20    End of Session   Activity Tolerance: Patient tolerated treatment well Patient left: in bed Nurse Communication: Mobility status PT Visit Diagnosis: Other abnormalities of gait and mobility (R26.89)    Time: 2229-7989 PT Time Calculation (min) (ACUTE ONLY): 18 min   Charges:   PT Evaluation $PT Eval Low Complexity: 1 Low          Arlyss Gandy, PT, DPT Acute Rehabilitation Office [phone]   Arlyss Gandy 02/06/2022, 9:08 AM

## 2022-02-06 NOTE — Plan of Care (Signed)
  Problem: Education: Goal: Knowledge of General Education information will improve Description: Including pain rating scale, medication(s)/side effects and non-pharmacologic comfort measures Outcome: Adequate for Discharge   Problem: Health Behavior/Discharge Planning: Goal: Ability to manage health-related needs will improve Outcome: Adequate for Discharge   Problem: Clinical Measurements: Goal: Ability to maintain clinical measurements within normal limits will improve Outcome: Adequate for Discharge Goal: Will remain free from infection Outcome: Adequate for Discharge Goal: Diagnostic test results will improve Outcome: Adequate for Discharge Goal: Respiratory complications will improve Outcome: Adequate for Discharge Goal: Cardiovascular complication will be avoided Outcome: Adequate for Discharge   Problem: Nutrition: Goal: Adequate nutrition will be maintained Outcome: Adequate for Discharge   Problem: Activity: Goal: Risk for activity intolerance will decrease Outcome: Adequate for Discharge   Problem: Coping: Goal: Level of anxiety will decrease Outcome: Adequate for Discharge   Problem: Elimination: Goal: Will not experience complications related to bowel motility Outcome: Adequate for Discharge Goal: Will not experience complications related to urinary retention Outcome: Adequate for Discharge   Problem: Pain Managment: Goal: General experience of comfort will improve Outcome: Adequate for Discharge   Problem: Safety: Goal: Ability to remain free from injury will improve Outcome: Adequate for Discharge   Problem: Skin Integrity: Goal: Risk for impaired skin integrity will decrease Outcome: Adequate for Discharge   

## 2022-02-06 NOTE — ED Notes (Signed)
RN Attempted to call Pt's brother, Teola Bradley at [phone]. It went to VM. Will update at another time.
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
